# Patient Record
Sex: Female | Born: 1995 | Race: White | Hispanic: No | Marital: Single | Smoking: Never smoker
Health system: Southern US, Community
[De-identification: ages and names within clinical notes are randomized; demographics above are authoritative.]

## PROBLEM LIST (undated history)

## (undated) DIAGNOSIS — E079 Disorder of thyroid, unspecified: Secondary | ICD-10-CM

---

## 2015-03-13 ENCOUNTER — Encounter (HOSPITAL_BASED_OUTPATIENT_CLINIC_OR_DEPARTMENT_OTHER): Payer: Self-pay | Admitting: *Deleted

## 2015-03-13 ENCOUNTER — Emergency Department (HOSPITAL_BASED_OUTPATIENT_CLINIC_OR_DEPARTMENT_OTHER)
Admission: EM | Admit: 2015-03-13 | Discharge: 2015-03-13 | Disposition: A | Discharge status: Home / Self Care | Payer: BLUE CROSS/BLUE SHIELD | Attending: Emergency Medicine | Admitting: Emergency Medicine

## 2015-03-13 DIAGNOSIS — S0993XA Unspecified injury of face, initial encounter: Secondary | ICD-10-CM | POA: Diagnosis present

## 2015-03-13 DIAGNOSIS — S0990XA Unspecified injury of head, initial encounter: Secondary | ICD-10-CM | POA: Diagnosis not present

## 2015-03-13 DIAGNOSIS — Z880 Allergy status to penicillin: Secondary | ICD-10-CM | POA: Diagnosis not present

## 2015-03-13 DIAGNOSIS — W2106XA Struck by volleyball, initial encounter: Secondary | ICD-10-CM | POA: Diagnosis not present

## 2015-03-13 DIAGNOSIS — Y998 Other external cause status: Secondary | ICD-10-CM | POA: Diagnosis not present

## 2015-03-13 DIAGNOSIS — G44319 Acute post-traumatic headache, not intractable: Secondary | ICD-10-CM

## 2015-03-13 DIAGNOSIS — E079 Disorder of thyroid, unspecified: Secondary | ICD-10-CM | POA: Insufficient documentation

## 2015-03-13 DIAGNOSIS — Y9368 Activity, volleyball (beach) (court): Secondary | ICD-10-CM | POA: Insufficient documentation

## 2015-03-13 DIAGNOSIS — Y9289 Other specified places as the place of occurrence of the external cause: Secondary | ICD-10-CM | POA: Insufficient documentation

## 2015-03-13 HISTORY — DX: Disorder of thyroid, unspecified: E07.9

## 2015-03-13 MED ORDER — IBUPROFEN 800 MG PO TABS
800.0000 mg | ORAL_TABLET | Freq: Once | ORAL | Status: AC
  Administered 2015-03-13: 800 mg via ORAL
  Filled 2015-03-13: qty 1

## 2015-03-13 MED ORDER — ACETAMINOPHEN 325 MG PO TABS
650.0000 mg | ORAL_TABLET | Freq: Once | ORAL | Status: AC
  Administered 2015-03-13: 650 mg via ORAL
  Filled 2015-03-13: qty 2

## 2015-03-13 NOTE — Discharge Instructions (Signed)
Contusion °A contusion is a deep bruise. Contusions happen when an injury causes bleeding under the skin. Signs of bruising include pain, puffiness (swelling), and discolored skin. The contusion may turn blue, purple, or yellow. °HOME CARE  °· Put ice on the injured area. °¨ Put ice in a plastic bag. °¨ Place a towel between your skin and the bag. °¨ Leave the ice on for 15-20 minutes, 03-04 times a day. °· Only take medicine as told by your doctor. °· Rest the injured area. °· If possible, raise (elevate) the injured area to lessen puffiness. °GET HELP RIGHT AWAY IF:  °· You have more bruising or puffiness. °· You have pain that is getting worse. °· Your puffiness or pain is not helped by medicine. °MAKE SURE YOU:  °· Understand these instructions. °· Will watch your condition. °· Will get help right away if you are not doing well or get worse. °Document Released: 03/01/2008 Document Revised: 12/06/2011 Document Reviewed: 07/19/2011 °ExitCare® Patient Information ©2015 [employer]. This information is not intended to replace advice given to you by your health care provider. Make sure you discuss any questions you have with your health care provider. ° °

## 2015-03-13 NOTE — ED Provider Notes (Signed)
CSN: [id number]     Arrival date & time 03/13/15  2057 History  This chart was scribed for Linda Grizzle, MD by Octavia Heir, ED Scribe. This patient was seen in room MH02/MH02 and the patient's care was started at 9:14 PM.    Chief Complaint  Patient presents with  . Facial Injury     Patient is a 19 y.o. female presenting with facial injury. The history is provided by the patient. No language interpreter was used.  Facial Injury Mechanism of injury:  Direct blow Location:  Face Time since incident:  1 hour Pain details:    Quality:  Throbbing   Severity:  Moderate   Duration:  1 hour   Timing:  Constant   Progression:  Unchanged Chronicity:  New Foreign body present:  No foreign bodies Relieved by:  None tried Worsened by:  Nothing tried Ineffective treatments:  None tried Associated symptoms: headaches   Associated symptoms: no nausea and no vomiting     HPI Comments: Linda Hahn is a 19 y.o. female who presents to the Emergency Department complaining of a facial injury onset one hour ago. She has associated headache, dizziness, and visual disturbances. Pt notes that her head is throbbing. She denies LOC. Pt was hit in the face with a volleyball. She notes she has not taken any medication to alleviate the pain. Pt denies nausea and vomiting.   Past Medical History  Diagnosis Date  . Thyroid disease    History reviewed. No pertinent past surgical history. No family history on file. History  Substance Use Topics  . Smoking status: Never Smoker   . Smokeless tobacco: Not on file  . Alcohol Use: No   OB History    No data available     Review of Systems  Gastrointestinal: Negative for nausea and vomiting.  Neurological: Positive for headaches.      Allergies  Penicillins  Home Medications   Prior to Admission medications   Medication Sig Start Date End Date Taking? Authorizing Provider  Levothyroxine Sodium (SYNTHROID PO) Take by mouth.   Yes Historical  Provider, MD   Triage vitals: BP 125/86 mmHg  Pulse 71  Temp(Src) 97.9 F (36.6 C) (Oral)  Resp 20  Ht  (1.727 m)  Wt 145 lb (65.772 kg)  BMI 22.05 kg/m2  SpO2 100%  LMP 03/06/2015 Physical Exam  Constitutional: She is oriented to person, place, and time. She appears well-developed and well-nourished.  HENT:  Head: Normocephalic and atraumatic.  Right Ear: Tympanic membrane and external ear normal.  Left Ear: Tympanic membrane and external ear normal.  Nose: Nose normal. Right sinus exhibits no maxillary sinus tenderness and no frontal sinus tenderness. Left sinus exhibits no maxillary sinus tenderness and no frontal sinus tenderness.  Mouth/Throat: Oropharynx is clear and moist.  Eyes: Conjunctivae and EOM are normal. Pupils are equal, round, and reactive to light. Right eye exhibits no nystagmus. Left eye exhibits no nystagmus.  Neck: Normal range of motion. Neck supple. No JVD present. No tracheal deviation present. No thyromegaly present.  Cardiovascular: Normal rate, regular rhythm, normal heart sounds and intact distal pulses.   Pulmonary/Chest: Effort normal and breath sounds normal. No respiratory distress. She has no wheezes. She exhibits no tenderness.  Abdominal: Soft. Bowel sounds are normal. She exhibits no distension and no mass. There is no tenderness. There is no guarding.  Musculoskeletal: Normal range of motion. She exhibits no edema or tenderness.  Lymphadenopathy:    She has no cervical  adenopathy.  Neurological: She is alert and oriented to person, place, and time. She has normal strength and normal reflexes. No cranial nerve deficit or sensory deficit. She displays a negative Romberg sign. Gait normal. GCS eye subscore is 4. GCS verbal subscore is 5. GCS motor subscore is 6.  Reflex Scores:      Tricep reflexes are 2+ on the right side and 2+ on the left side.      Bicep reflexes are 2+ on the right side and 2+ on the left side.      Brachioradialis reflexes  are 2+ on the right side and 2+ on the left side.      Patellar reflexes are 2+ on the right side and 2+ on the left side.      Achilles reflexes are 2+ on the right side and 2+ on the left side. Strength is normal and equal throughout. Cranial nerves grossly intact. Patient fluent. No gross ataxia and patient able to ambulate without difficulty.  Skin: Skin is warm and dry. No rash noted.  Psychiatric: She has a normal mood and affect. Her behavior is normal. Judgment and thought content normal.  Nursing note and vitals reviewed.   ED Course  Procedures  DIAGNOSTIC STUDIES: Oxygen Saturation is 100% on RA, normal by my interpretation.  COORDINATION OF CARE: 9:18 PM Discussed treatment plan which includes follow up if symptoms persist with pt at bedside and pt agreed to plan.  Labs Review Labs Reviewed - No data to display  Imaging Review No results found.   EKG Interpretation None      MDM   Final diagnoses:  Facial injury, initial encounter  Acute post-traumatic headache, not intractable    I personally performed the services described in this documentation, which was scribed in my presence. The recorded information has been reviewed and considered.   Linda Grizzle, MD 03/13/15 2130

## 2015-03-13 NOTE — ED Notes (Signed)
Hit to rt side of face w served volley ball no deformity noted  No loc  States some dizziness and blurred vision

## 2015-03-13 NOTE — ED Notes (Signed)
States she was hit in the face with a volleyball an hour ago. No LOC. Headache. Dizziness.

## 2015-03-16 ENCOUNTER — Encounter (HOSPITAL_COMMUNITY): Payer: Self-pay

## 2015-03-16 ENCOUNTER — Emergency Department (HOSPITAL_COMMUNITY)
Admission: EM | Admit: 2015-03-16 | Discharge: 2015-03-16 | Disposition: A | Discharge status: Other Acute Inpatient Hospital | Payer: BLUE CROSS/BLUE SHIELD | Source: Home / Self Care | Attending: Emergency Medicine | Admitting: Emergency Medicine

## 2015-03-16 ENCOUNTER — Encounter (HOSPITAL_COMMUNITY): Payer: Self-pay | Admitting: Emergency Medicine

## 2015-03-16 ENCOUNTER — Emergency Department (HOSPITAL_COMMUNITY): Payer: BLUE CROSS/BLUE SHIELD

## 2015-03-16 ENCOUNTER — Emergency Department (HOSPITAL_COMMUNITY)
Admission: EM | Admit: 2015-03-16 | Discharge: 2015-03-16 | Disposition: A | Discharge status: Home / Self Care | Payer: BLUE CROSS/BLUE SHIELD | Attending: Emergency Medicine | Admitting: Emergency Medicine

## 2015-03-16 DIAGNOSIS — G44309 Post-traumatic headache, unspecified, not intractable: Secondary | ICD-10-CM | POA: Insufficient documentation

## 2015-03-16 DIAGNOSIS — Y998 Other external cause status: Secondary | ICD-10-CM | POA: Diagnosis not present

## 2015-03-16 DIAGNOSIS — S0990XA Unspecified injury of head, initial encounter: Secondary | ICD-10-CM

## 2015-03-16 DIAGNOSIS — Y9289 Other specified places as the place of occurrence of the external cause: Secondary | ICD-10-CM | POA: Diagnosis not present

## 2015-03-16 DIAGNOSIS — W2106XA Struck by volleyball, initial encounter: Secondary | ICD-10-CM | POA: Diagnosis not present

## 2015-03-16 DIAGNOSIS — Z79899 Other long term (current) drug therapy: Secondary | ICD-10-CM | POA: Diagnosis not present

## 2015-03-16 DIAGNOSIS — Z880 Allergy status to penicillin: Secondary | ICD-10-CM | POA: Diagnosis not present

## 2015-03-16 DIAGNOSIS — E079 Disorder of thyroid, unspecified: Secondary | ICD-10-CM | POA: Insufficient documentation

## 2015-03-16 DIAGNOSIS — Y9368 Activity, volleyball (beach) (court): Secondary | ICD-10-CM | POA: Diagnosis not present

## 2015-03-16 DIAGNOSIS — R519 Headache, unspecified: Secondary | ICD-10-CM

## 2015-03-16 DIAGNOSIS — R51 Headache: Secondary | ICD-10-CM

## 2015-03-16 DIAGNOSIS — R110 Nausea: Secondary | ICD-10-CM | POA: Diagnosis not present

## 2015-03-16 MED ORDER — KETOROLAC TROMETHAMINE 60 MG/2ML IM SOLN
60.0000 mg | Freq: Once | INTRAMUSCULAR | Status: DC
  Filled 2015-03-16: qty 2

## 2015-03-16 MED ORDER — ONDANSETRON 4 MG PO TBDP: 4.0000 mg | ORAL_TABLET | Freq: Once | ORAL | Status: DC

## 2015-03-16 MED ORDER — IBUPROFEN 800 MG PO TABS: 800.0000 mg | ORAL_TABLET | Freq: Three times a day (TID) | ORAL | Status: AC

## 2015-03-16 MED ORDER — DIPHENHYDRAMINE HCL 50 MG/ML IJ SOLN
25.0000 mg | Freq: Once | INTRAMUSCULAR | Status: DC
  Filled 2015-03-16: qty 1

## 2015-03-16 MED ORDER — METOCLOPRAMIDE HCL 5 MG/ML IJ SOLN
10.0000 mg | Freq: Once | INTRAMUSCULAR | Status: AC
  Administered 2015-03-16: 10 mg via INTRAVENOUS

## 2015-03-16 MED ORDER — KETOROLAC TROMETHAMINE 30 MG/ML IJ SOLN
30.0000 mg | Freq: Once | INTRAMUSCULAR | Status: AC
  Administered 2015-03-16: 30 mg via INTRAVENOUS
  Filled 2015-03-16: qty 1

## 2015-03-16 MED ORDER — METOCLOPRAMIDE HCL 5 MG/ML IJ SOLN
10.0000 mg | Freq: Once | INTRAMUSCULAR | Status: DC
  Filled 2015-03-16: qty 2

## 2015-03-16 MED ORDER — TRAMADOL HCL 50 MG PO TABS: 50.0000 mg | ORAL_TABLET | Freq: Four times a day (QID) | ORAL | Status: AC | PRN

## 2015-03-16 MED ORDER — DIPHENHYDRAMINE HCL 50 MG/ML IJ SOLN
25.0000 mg | Freq: Once | INTRAMUSCULAR | Status: AC
  Administered 2015-03-16: 25 mg via INTRAVENOUS

## 2015-03-16 NOTE — ED Notes (Signed)
Pt. transferred from urgent care due to persistent/worsening headache and nausea since last Thursday after being hit  at right side of face by a volleyball , no LOC / alert and oriented ,

## 2015-03-16 NOTE — ED Notes (Signed)
Hit in head 6-16. Seen [facility]. C/o her headache is getting worse, dizziness is worse. Has been using tylenol, motrin, excedrin for pain. C/o pain is '8' on 1-10 scale

## 2015-03-16 NOTE — Discharge Instructions (Signed)
Concussion  A concussion, or closed-head injury, is a brain injury caused by a direct blow to the head or by a quick and sudden movement (jolt) of the head or neck. Concussions are usually not life-threatening. Even so, the effects of a concussion can be serious. If you have had a concussion before, you are more likely to experience concussion-like symptoms after a direct blow to the head.   CAUSES  · Direct blow to the head, such as from running into another player during a soccer game, being hit in a fight, or hitting your head on a hard surface.  · A jolt of the head or neck that causes the brain to move back and forth inside the skull, such as in a car crash.  SIGNS AND SYMPTOMS  The signs of a concussion can be hard to notice. Early on, they may be missed by you, family members, and health care providers. You may look fine but act or feel differently.  Symptoms are usually temporary, but they may last for days, weeks, or even longer. Some symptoms may appear right away while others may not show up for hours or days. Every head injury is different. Symptoms include:  · Mild to moderate headaches that will not go away.  · A feeling of pressure inside your head.  · Having more trouble than usual:  · Learning or remembering things you have heard.  · Answering questions.  · Paying attention or concentrating.  · Organizing daily tasks.  · Making decisions and solving problems.  · Slowness in thinking, acting or reacting, speaking, or reading.  · Getting lost or being easily confused.  · Feeling tired all the time or lacking energy (fatigued).  · Feeling drowsy.  · Sleep disturbances.  · Sleeping more than usual.  · Sleeping less than usual.  · Trouble falling asleep.  · Trouble sleeping (insomnia).  · Loss of balance or feeling lightheaded or dizzy.  · Nausea or vomiting.  · Numbness or tingling.  · Increased sensitivity to:  · Sounds.  · Lights.  · Distractions.  · Vision problems or eyes that tire  easily.  · Diminished sense of taste or smell.  · Ringing in the ears.  · Mood changes such as feeling sad or anxious.  · Becoming easily irritated or angry for little or no reason.  · Lack of motivation.  · Seeing or hearing things other people do not see or hear (hallucinations).  DIAGNOSIS  Your health care provider can usually diagnose a concussion based on a description of your injury and symptoms. He or she will ask whether you passed out (lost consciousness) and whether you are having trouble remembering events that happened right before and during your injury.  Your evaluation might include:  · A brain scan to look for signs of injury to the brain. Even if the test shows no injury, you may still have a concussion.  · Blood tests to be sure other problems are not present.  TREATMENT  · Concussions are usually treated in an emergency department, in urgent care, or at a clinic. You may need to stay in the hospital overnight for further treatment.  · Tell your health care provider if you are taking any medicines, including prescription medicines, over-the-counter medicines, and natural remedies. Some medicines, such as blood thinners (anticoagulants) and aspirin, may increase the chance of complications. Also tell your health care provider whether you have had alcohol or are taking illegal drugs. This information   may affect treatment.  · Your health care provider will send you home with important instructions to follow.  · How fast you will recover from a concussion depends on many factors. These factors include how severe your concussion is, what part of your brain was injured, your age, and how healthy you were before the concussion.  · Most people with mild injuries recover fully. Recovery can take time. In general, recovery is slower in older persons. Also, persons who have had a concussion in the past or have other medical problems may find that it takes longer to recover from their current injury.  HOME  CARE INSTRUCTIONS  General Instructions  · Carefully follow the directions your health care provider gave you.  · Only take over-the-counter or prescription medicines for pain, discomfort, or fever as directed by your health care provider.  · Take only those medicines that your health care provider has approved.  · Do not drink alcohol until your health care provider says you are well enough to do so. Alcohol and certain other drugs may slow your recovery and can put you at risk of further injury.  · If it is harder than usual to remember things, write them down.  · If you are easily distracted, try to do one thing at a time. For example, do not try to watch TV while fixing dinner.  · Talk with family members or close friends when making important decisions.  · Keep all follow-up appointments. Repeated evaluation of your symptoms is recommended for your recovery.  · Watch your symptoms and tell others to do the same. Complications sometimes occur after a concussion. Older adults with a brain injury may have a higher risk of serious complications, such as a blood clot on the brain.  · Tell your teachers, school nurse, school counselor, coach, athletic trainer, or work manager about your injury, symptoms, and restrictions. Tell them about what you can or cannot do. They should watch for:  ¨ Increased problems with attention or concentration.  ¨ Increased difficulty remembering or learning new information.  ¨ Increased time needed to complete tasks or assignments.  ¨ Increased irritability or decreased ability to cope with stress.  ¨ Increased symptoms.  · Rest. Rest helps the brain to heal. Make sure you:  ¨ Get plenty of sleep at night. Avoid staying up late at night.  ¨ Keep the same bedtime hours on weekends and weekdays.  ¨ Rest during the day. Take daytime naps or rest breaks when you feel tired.  · Limit activities that require a lot of thought or concentration. These include:  ¨ Doing homework or job-related  work.  ¨ Watching TV.  ¨ Working on the computer.  · Avoid any situation where there is potential for another head injury (football, hockey, soccer, basketball, martial arts, downhill snow sports and horseback riding). Your condition will get worse every time you experience a concussion. You should avoid these activities until you are evaluated by the appropriate follow-up health care providers.  Returning To Your Regular Activities  You will need to return to your normal activities slowly, not all at once. You must give your body and brain enough time for recovery.  · Do not return to sports or other athletic activities until your health care provider tells you it is safe to do so.  · Ask your health care provider when you can drive, ride a bicycle, or operate heavy machinery. Your ability to react may be slower after a   brain injury. Never do these activities if you are dizzy.  · Ask your health care provider about when you can return to work or school.  Preventing Another Concussion  It is very important to avoid another brain injury, especially before you have recovered. In rare cases, another injury can lead to permanent brain damage, brain swelling, or death. The risk of this is greatest during the first 7-10 days after a head injury. Avoid injuries by:  · Wearing a seat belt when riding in a car.  · Drinking alcohol only in moderation.  · Wearing a helmet when biking, skiing, skateboarding, skating, or doing similar activities.  · Avoiding activities that could lead to a second concussion, such as contact or recreational sports, until your health care provider says it is okay.  · Taking safety measures in your home.  ¨ Remove clutter and tripping hazards from floors and stairways.  ¨ Use grab bars in bathrooms and handrails by stairs.  ¨ Place non-slip mats on floors and in bathtubs.  ¨ Improve lighting in dim areas.  SEEK MEDICAL CARE IF:  · You have increased problems paying attention or  concentrating.  · You have increased difficulty remembering or learning new information.  · You need more time to complete tasks or assignments than before.  · You have increased irritability or decreased ability to cope with stress.  · You have more symptoms than before.  Seek medical care if you have any of the following symptoms for more than 2 weeks after your injury:  · Lasting (chronic) headaches.  · Dizziness or balance problems.  · Nausea.  · Vision problems.  · Increased sensitivity to noise or light.  · Depression or mood swings.  · Anxiety or irritability.  · Memory problems.  · Difficulty concentrating or paying attention.  · Sleep problems.  · Feeling tired all the time.  SEEK IMMEDIATE MEDICAL CARE IF:  · You have severe or worsening headaches. These may be a sign of a blood clot in the brain.  · You have weakness (even if only in one hand, leg, or part of the face).  · You have numbness.  · You have decreased coordination.  · You vomit repeatedly.  · You have increased sleepiness.  · One pupil is larger than the other.  · You have convulsions.  · You have slurred speech.  · You have increased confusion. This may be a sign of a blood clot in the brain.  · You have increased restlessness, agitation, or irritability.  · You are unable to recognize people or places.  · You have neck pain.  · It is difficult to wake you up.  · You have unusual behavior changes.  · You lose consciousness.  MAKE SURE YOU:  · Understand these instructions.  · Will watch your condition.  · Will get help right away if you are not doing well or get worse.  Document Released: 12/04/2003 Document Revised: 09/18/2013 Document Reviewed: 04/05/2013  ExitCare® Patient Information ©2015 [employer]. This information is not intended to replace advice given to you by your health care provider. Make sure you discuss any questions you have with your health care provider.  Post-Concussion Syndrome  Post-concussion syndrome describes the  symptoms that can occur after a head injury. These symptoms can last from weeks to months.  CAUSES   It is not clear why some head injuries cause post-concussion syndrome. It can occur whether your head injury was mild or severe   and whether you were wearing head protection or not.   SIGNS AND SYMPTOMS  · Memory difficulties.  · Dizziness.  · Headaches.  · Double vision or blurry vision.  · Sensitivity to light.  · Hearing difficulties.  · Depression.  · Tiredness.  · Weakness.  · Difficulty with concentration.  · Difficulty sleeping or staying asleep.  · Vomiting.  · Poor balance or instability on your feet.  · Slow reaction time.  · Difficulty learning and remembering things you have heard.  DIAGNOSIS   There is no test to determine whether you have post-concussion syndrome. Your health care provider may order an imaging scan of your brain, such as a CT scan, to check for other problems that may be causing your symptoms (such as severe injury inside your skull).  TREATMENT   Usually, these problems disappear over time without medical care. Your health care provider may prescribe medicine to help ease your symptoms. It is important to follow up with a neurologist to evaluate your recovery and address any lingering symptoms or issues.  HOME CARE INSTRUCTIONS   · Only take over-the-counter or prescription medicines for pain, discomfort, or fever as directed by your health care provider. Do not take aspirin. Aspirin can slow blood clotting.  · Sleep with your head slightly elevated to help with headaches.  · Avoid any situation where there is potential for another head injury (football, hockey, soccer, basketball, martial arts, downhill snow sports, and horseback riding). Your condition will get worse every time you experience a concussion. You should avoid these activities until you are evaluated by the appropriate follow-up health care providers.  · Keep all follow-up appointments as directed by your health care  provider.  SEEK IMMEDIATE MEDICAL CARE IF:  · You develop confusion or unusual drowsiness.  · You cannot wake the injured person.  · You develop nausea or persistent, forceful vomiting.  · You feel like you are moving when you are not (vertigo).  · You notice the injured person's eyes moving rapidly back and forth. This may be a sign of vertigo.  · You have convulsions or faint.  · You have severe, persistent headaches that are not relieved by medicine.  · You cannot use your arms or legs normally.  · Your pupils change size.  · You have clear or bloody discharge from the nose or ears.  · Your problems are getting worse, not better.  MAKE SURE YOU:  · Understand these instructions.  · Will watch your condition.  · Will get help right away if you are not doing well or get worse.  Document Released: 03/05/2002 Document Revised: 07/04/2013 Document Reviewed: 12/19/2013  ExitCare® Patient Information ©2015 [employer]. This information is not intended to replace advice given to you by your health care provider. Make sure you discuss any questions you have with your health care provider.

## 2015-03-16 NOTE — ED Provider Notes (Signed)
CSN: [id number]     Arrival date & time 03/16/15  1905 History   First MD Initiated Contact with Patient 03/16/15 1926     Chief Complaint  Patient presents with  . Head Injury   (Consider location/radiation/quality/duration/timing/severity/associated sxs/prior Treatment) HPI  She is a 18 year old woman here for evaluation of head injury. She was hit in the face with a volleyball on Thursday.  She denies any loss of consciousness. She was seen at [facility] at that time.  She was discharged with symptomatic treatment.  Since then, her headache, dizziness, and nausea has been getting worse. She denies any vomiting. The dizziness is described as feeling like her eyes go out of focus. She also reports feeling off balance when she first stands up.  Past Medical History  Diagnosis Date  . Thyroid disease    History reviewed. No pertinent past surgical history. History reviewed. No pertinent family history. History  Substance Use Topics  . Smoking status: Never Smoker   . Smokeless tobacco: Not on file  . Alcohol Use: No   OB History    No data available     Review of Systems As in history of present illness Allergies  Penicillins  Home Medications   Prior to Admission medications   Medication Sig Start Date End Date Taking? Authorizing Provider  Levothyroxine Sodium (SYNTHROID PO) Take by mouth.    Historical Provider, MD   BP 121/91 mmHg  Pulse 72  Temp(Src) 97.3 F (36.3 C) (Oral)  Resp 12  SpO2 99%  LMP 03/06/2015 Physical Exam  Constitutional: She is oriented to person, place, and time. She appears well-developed and well-nourished. No distress.  Eyes: Conjunctivae and EOM are normal. Pupils are equal, round, and reactive to light.  Neck: Neck supple.  Cardiovascular: Normal rate, regular rhythm and normal heart sounds.   No murmur heard. Pulmonary/Chest: Effort normal and breath sounds normal. No respiratory distress. She has no wheezes. She has no rales.   Neurological: She is alert and oriented to person, place, and time. No cranial nerve deficit. She exhibits normal muscle tone. Coordination normal.  Romberg negative    ED Course  Procedures (including critical care time) Labs Review Labs Reviewed - No data to display  Imaging Review No results found.   MDM   1. Head injury, initial encounter   2. Acute nonintractable headache, unspecified headache type   3. Nausea    I suspect this is postconcussive syndrome. However, given that her symptoms are worsening and she has not had any imaging, will transfer to [facility] ED via shuttle for head CT. Zofran 4 mg ODT given to help with nausea.    Charm Rings, MD 03/16/15 [phone]

## 2015-03-16 NOTE — ED Provider Notes (Signed)
CSN: [id number]     Arrival date & time 03/16/15  1948 History   First MD Initiated Contact with Patient 03/16/15 2134     Chief Complaint  Patient presents with  . Headache     (Consider location/radiation/quality/duration/timing/severity/associated sxs/prior Treatment) HPI Comments: Presents to the emergency department for evaluation of headache. Patient reports injury 3 days ago. She was hit in the head with a volleyball and has had persistent headaches since. Patient reports that she was not knocked out when the original injury occurred. She has, however, had persistent dizziness, headache and nausea since the episode occurred. She was seen in urgent care today and referred to the ER for repeat evaluation.  Patient is a 19 y.o. female presenting with headaches.  Headache Associated symptoms: dizziness and nausea     Past Medical History  Diagnosis Date  . Thyroid disease    No past surgical history on file. No family history on file. History  Substance Use Topics  . Smoking status: Never Smoker   . Smokeless tobacco: Not on file  . Alcohol Use: No   OB History    No data available     Review of Systems  Gastrointestinal: Positive for nausea.  Neurological: Positive for dizziness and headaches.  All other systems reviewed and are negative.     Allergies  Penicillins  Home Medications   Prior to Admission medications   Medication Sig Start Date End Date Taking? Authorizing Provider  levothyroxine (SYNTHROID, LEVOTHROID) 150 MCG tablet Take 150 mcg by mouth every other day.   Yes Historical Provider, MD  levothyroxine (SYNTHROID, LEVOTHROID) 175 MCG tablet Take 175 mcg by mouth every other day.   Yes Historical Provider, MD  ibuprofen (ADVIL,MOTRIN) 800 MG tablet Take 1 tablet (800 mg total) by mouth 3 (three) times daily. 03/16/15   Gilda Crease, MD  traMADol (ULTRAM) 50 MG tablet Take 1 tablet (50 mg total) by mouth every 6 (six) hours as needed. 03/16/15    Gilda Crease, MD   BP 128/85 mmHg  Pulse 83  Temp(Src) 97.5 F (36.4 C) (Oral)  Resp 12  Ht  (1.727 m)  Wt 142 lb 7 oz (64.609 kg)  BMI 21.66 kg/m2  SpO2 100%  LMP 03/06/2015 Physical Exam  Constitutional: She is oriented to person, place, and time. She appears well-developed and well-nourished. No distress.  HENT:  Head: Normocephalic and atraumatic.  Right Ear: Hearing normal.  Left Ear: Hearing normal.  Nose: Nose normal.  Mouth/Throat: Oropharynx is clear and moist and mucous membranes are normal.  Eyes: Conjunctivae and EOM are normal. Pupils are equal, round, and reactive to light.  Neck: Normal range of motion. Neck supple.  Cardiovascular: Regular rhythm, S1 normal and S2 normal.  Exam reveals no gallop and no friction rub.   No murmur heard. Pulmonary/Chest: Effort normal and breath sounds normal. No respiratory distress. She exhibits no tenderness.  Abdominal: Soft. Normal appearance and bowel sounds are normal. There is no hepatosplenomegaly. There is no tenderness. There is no rebound, no guarding, no tenderness at McBurney's point and negative Murphy's sign. No hernia.  Musculoskeletal: Normal range of motion.  Neurological: She is alert and oriented to person, place, and time. She has normal strength. No cranial nerve deficit or sensory deficit. Coordination normal. GCS eye subscore is 4. GCS verbal subscore is 5. GCS motor subscore is 6.  Extraocular muscle movement: normal No visual field cut Pupils: equal and reactive both direct and consensual response is  normal No nystagmus present    Sensory function is intact to light touch, pinprick Proprioception intact  Grip strength 5/5 symmetric in upper extremities No pronator drift Normal finger to nose bilaterally  Lower extremity strength 5/5 against gravity Normal heel to shin bilaterally  Gait: normal   Skin: Skin is warm, dry and intact. No rash noted. No cyanosis.  Psychiatric: She has a  normal mood and affect. Her speech is normal and behavior is normal. Thought content normal.  Nursing note and vitals reviewed.   ED Course  Procedures (including critical care time) Labs Review Labs Reviewed - No data to display  Imaging Review Ct Head Wo Contrast  03/16/2015   CLINICAL DATA:  Patient hit in head with volleyball with headache and dizziness  EXAM: CT HEAD WITHOUT CONTRAST  TECHNIQUE: Contiguous axial images were obtained from the base of the skull through the vertex without intravenous contrast.  COMPARISON:  None.  FINDINGS: The ventricles are normal in size and configuration. There is no mass, hemorrhage, extra-axial fluid collection, or midline shift. Gray-white compartments are normal. No acute infarct apparent. Bony calvarium appears intact. The mastoid air cells are clear.  IMPRESSION: Study within normal limits.   Electronically Signed   By: Bretta Bang III M.D.   On: 03/16/2015 22:14     EKG Interpretation None      MDM   Final diagnoses:  Head injury  Post-concussion headache    Presents to the emergency department for evaluation of persistent headache several days after head injury. Patient had a normal neurologic examination. CT scan was negative. Symptoms are consistent with headache associated with concussion, cannot rule out early postconcussive syndrome. Patient administered Toradol, Reglan, Benadryl with significant improvement. She was prescribed Ultram and ibuprofen, follow-up with neurology.    Gilda Crease, MD 03/16/15 2330

## 2016-03-26 IMAGING — CT CT HEAD W/O CM
1 series · 16 of 30 positions shown, 20 images · non-contrast
Comparison: None.

CLINICAL DATA: Patient hit in head with volleyball with headache
and dizziness

EXAM:
CT HEAD WITHOUT CONTRAST
TECHNIQUE: Contiguous axial images were obtained from the base of the skull
through the vertex without intravenous contrast.

[Series 2: head 5.0 h30s · axial · 0.41mm/px · z∈[-136,-1]mm · 16 of 31 slices shown, 20 images]
[im 2/31  brain]
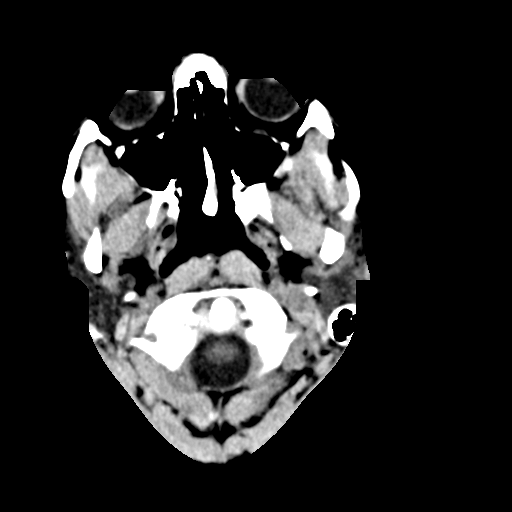
[im 2/31  bone]
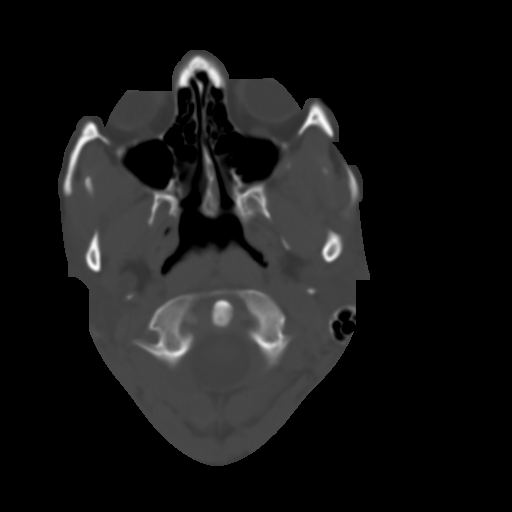
[im 4/31  brain]
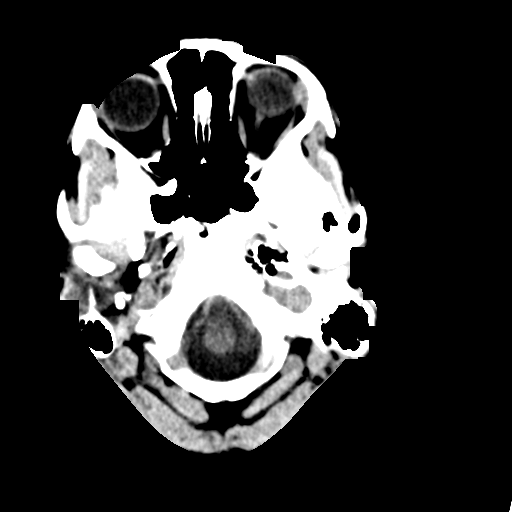
[im 6/31  brain]
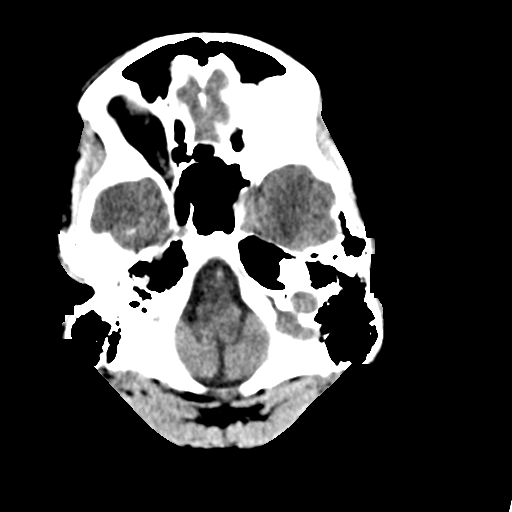
[im 8/31  brain]
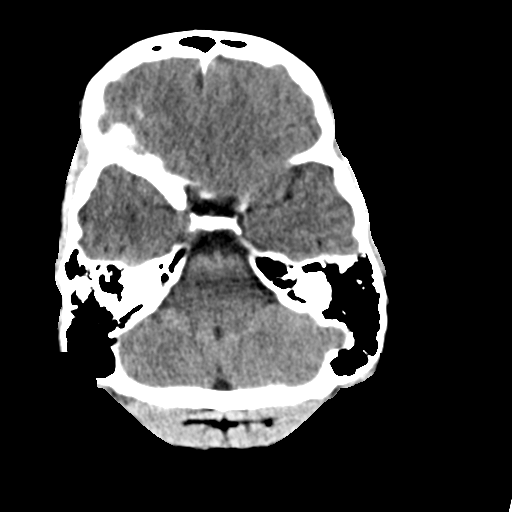
[im 9/31  brain]
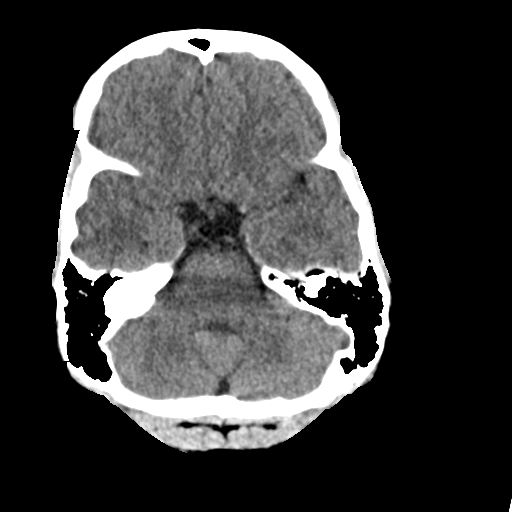
[im 9/31  bone]
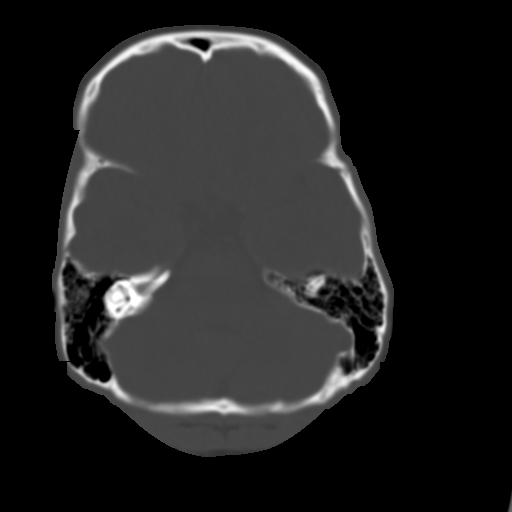
[im 11/31  brain]
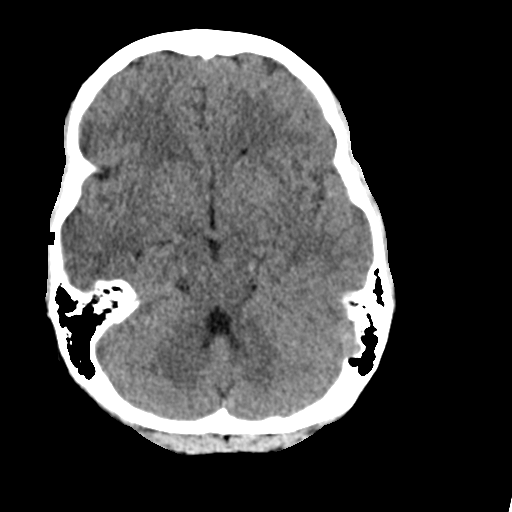
[im 13/31  brain]
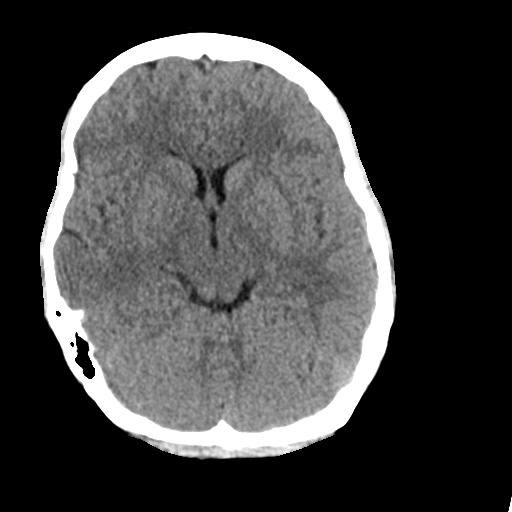
[im 15/31  brain]
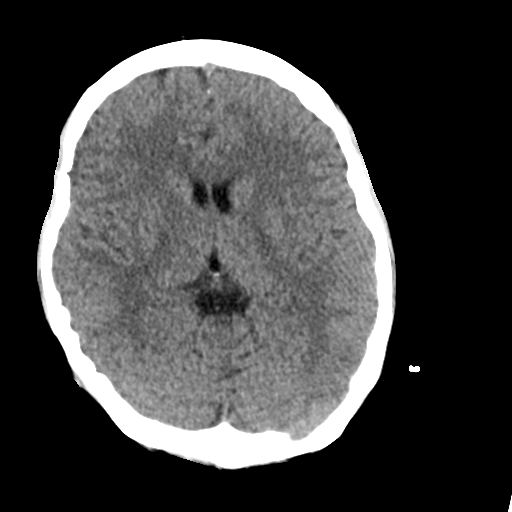
[im 16/31  brain]
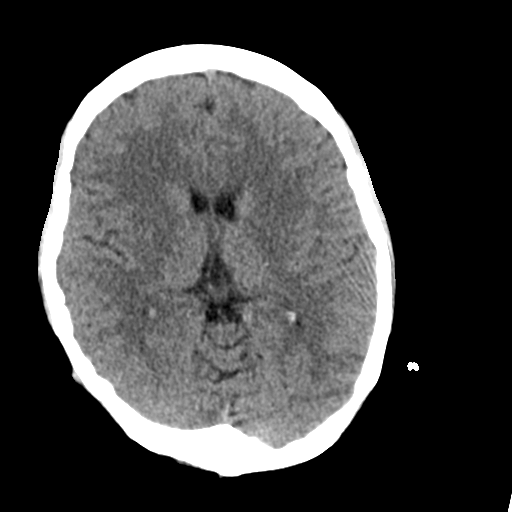
[im 16/31  bone]
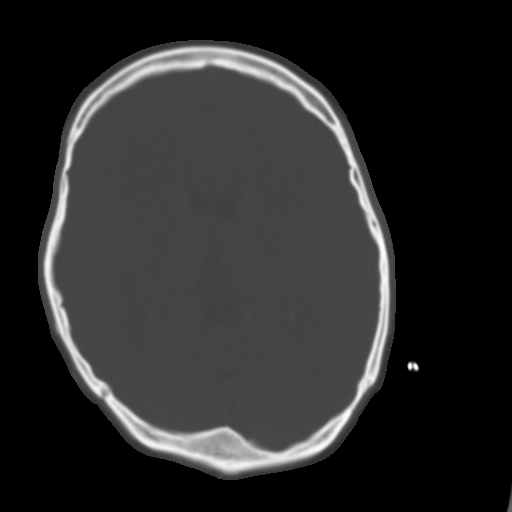
[im 18/31  brain]
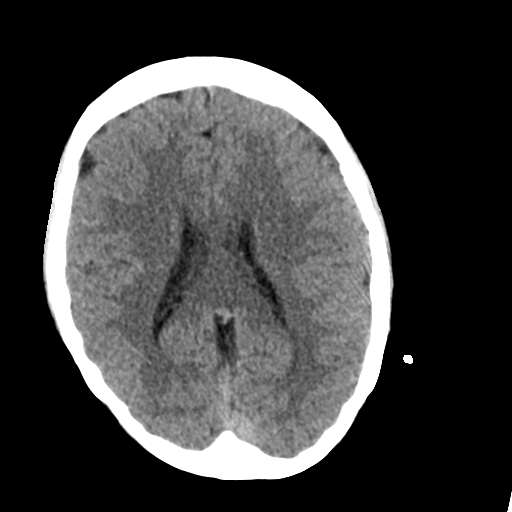
[im 20/31  brain]
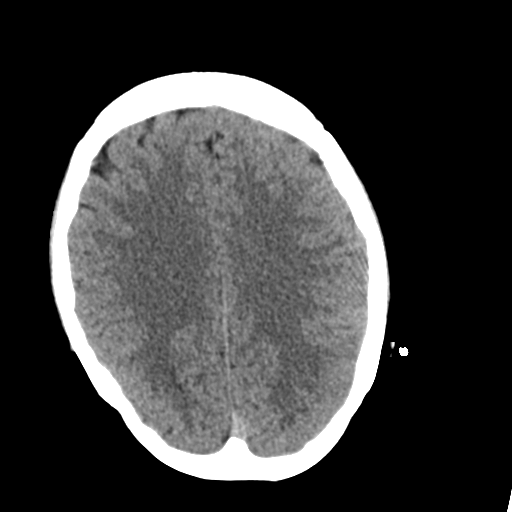
[im 22/31  brain]
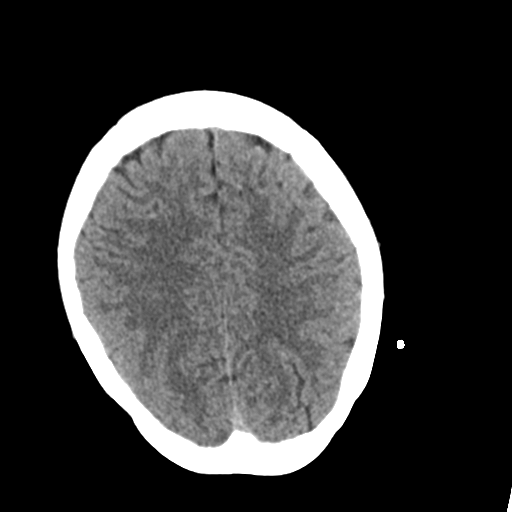
[im 23/31  brain]
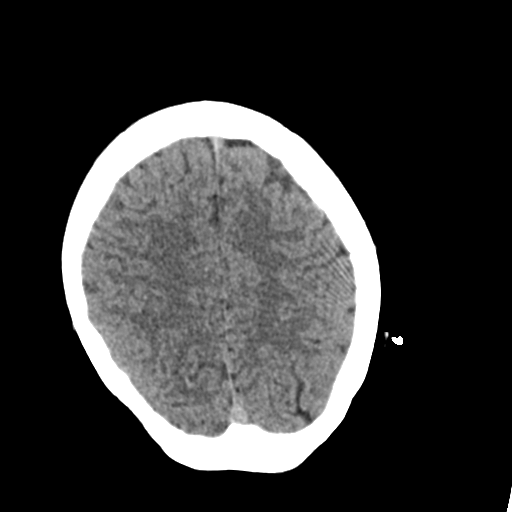
[im 23/31  bone]
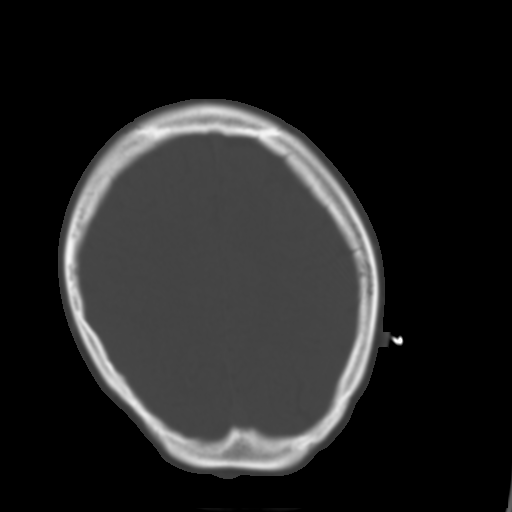
[im 25/31  brain]
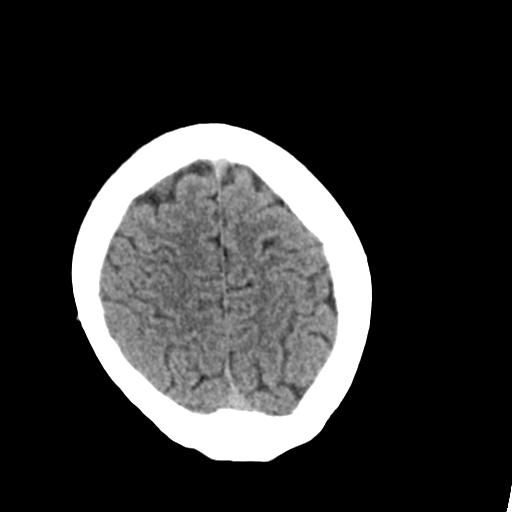
[im 27/31  brain]
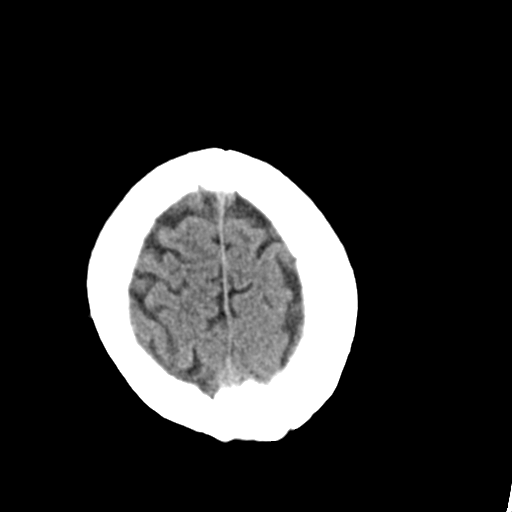
[im 29/31  brain]
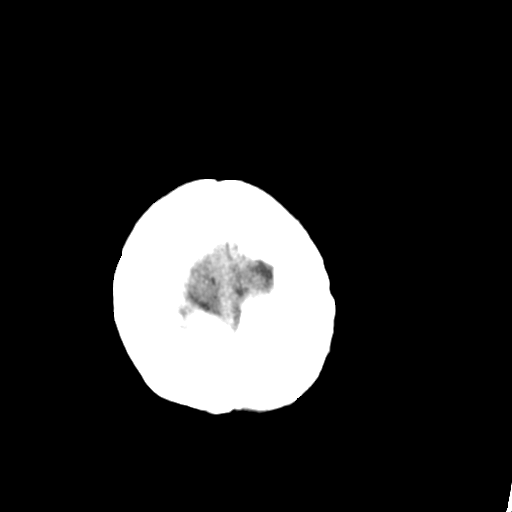

[16 of 30 positions shown; findings below may reference images not displayed]

FINDINGS: The ventricles are normal in size and configuration. There is no
mass, hemorrhage, extra-axial fluid collection, or midline shift.
Gray-white compartments are normal. No acute infarct apparent. Bony
calvarium appears intact. The mastoid air cells are clear.
IMPRESSION: Study within normal limits.
